# Patient Record
Sex: Male | Born: 1995
Health system: Southern US, Community
[De-identification: ages and names within clinical notes are randomized; demographics above are authoritative.]

---

## 2017-01-03 ENCOUNTER — Emergency Department (HOSPITAL_BASED_OUTPATIENT_CLINIC_OR_DEPARTMENT_OTHER): Payer: Worker's Compensation

## 2017-01-03 ENCOUNTER — Emergency Department (HOSPITAL_BASED_OUTPATIENT_CLINIC_OR_DEPARTMENT_OTHER)
Admission: EM | Admit: 2017-01-03 | Discharge: 2017-01-03 | Disposition: A | Discharge status: Home / Self Care | Payer: Worker's Compensation | Attending: Emergency Medicine | Admitting: Emergency Medicine

## 2017-01-03 ENCOUNTER — Encounter (HOSPITAL_BASED_OUTPATIENT_CLINIC_OR_DEPARTMENT_OTHER): Payer: Self-pay

## 2017-01-03 DIAGNOSIS — S67196A Crushing injury of right little finger, initial encounter: Secondary | ICD-10-CM | POA: Insufficient documentation

## 2017-01-03 DIAGNOSIS — W230XXA Caught, crushed, jammed, or pinched between moving objects, initial encounter: Secondary | ICD-10-CM | POA: Insufficient documentation

## 2017-01-03 DIAGNOSIS — S62639B Displaced fracture of distal phalanx of unspecified finger, initial encounter for open fracture: Secondary | ICD-10-CM

## 2017-01-03 DIAGNOSIS — F17200 Nicotine dependence, unspecified, uncomplicated: Secondary | ICD-10-CM | POA: Diagnosis not present

## 2017-01-03 DIAGNOSIS — Y990 Civilian activity done for income or pay: Secondary | ICD-10-CM | POA: Diagnosis not present

## 2017-01-03 DIAGNOSIS — Y929 Unspecified place or not applicable: Secondary | ICD-10-CM | POA: Insufficient documentation

## 2017-01-03 DIAGNOSIS — Y939 Activity, unspecified: Secondary | ICD-10-CM | POA: Insufficient documentation

## 2017-01-03 DIAGNOSIS — S6991XA Unspecified injury of right wrist, hand and finger(s), initial encounter: Secondary | ICD-10-CM | POA: Diagnosis present

## 2017-01-03 MED ORDER — CEPHALEXIN 500 MG PO CAPS: ORAL_CAPSULE | ORAL | 0 refills | Status: AC

## 2017-01-03 MED ORDER — MELOXICAM 15 MG PO TABS: 15.0000 mg | ORAL_TABLET | Freq: Every day | ORAL | 0 refills | Status: AC

## 2017-01-03 MED FILL — MELOXICAM 15 MG TABLET: 15 | 10 days supply | Qty: 10 | Fill #0

## 2017-01-03 MED FILL — CEPHALEXIN 500 MG CAPSULE: 500 | 5 days supply | Qty: 20 | Fill #0

## 2017-01-03 NOTE — Discharge Instructions (Signed)
Contact a health care provider if: °Your pain or swelling gets worse even with treatment. °You have trouble moving your finger. °Get help right away if: °Your finger becomes numb or blue. °

## 2017-01-03 NOTE — ED Triage Notes (Signed)
Pt injured right hand today at work-was seen at [facility]-pt states finger has been sutured-paperwork reads fx to 4th 5th right fingers-dsg intact to right 3-5 fingers-NAD-steady gait

## 2017-01-03 NOTE — ED Provider Notes (Addendum)
[facility]   CSN: [id number] Arrival date & time: 01/03/17  1147     History   Chief Complaint Chief Complaint  Patient presents with  . Hand Injury    HPI Adam Rice is a 21 y.o. male who presents emergency Department with chief complaint of finger injury. 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 Patient cut his hand smashed in a cart. He is right-hand dominant. He is diagnosed with an open tuft fracture of his right distal fifth phalanx and a small laceration to his fourth phalanx. He is given a gram of Ancef, had his tetanus updated and the PA there was unable to contact a hand surgeon for follow-up. Patient was sent here for further evaluation. He was not given images of his fracture for me to see and therefore had a repeat immature in ED. He denies any numbness tingling.  HPI  History reviewed. No pertinent past medical history.  There are no active problems to display for this patient.   History reviewed. No pertinent surgical history.     Home Medications    Prior to Admission medications   Not on File    Family History No family history on file.  Social History Social History  Substance Use Topics  . Smoking status: Current Every Day Smoker  . Smokeless tobacco: Never Used  . Alcohol use Yes     Comment: occ     Allergies   Patient has no known allergies.   Review of Systems Review of Systems Ten systems reviewed and are negative for acute change, except as noted in the HPI.    Physical Exam Updated Vital Signs BP (!) 140/95 (BP Location: Right Arm)   Pulse 70   Temp 98.7 F (37.1 C) (Oral)   Resp 20   Ht  (1.753 m)   Wt 60.3 kg (133 lb)   SpO2 100%   BMI 19.64 kg/m   Physical Exam  Constitutional: He appears well-developed and well-nourished. No distress.  HENT:  Head: Normocephalic and atraumatic.  Eyes: Conjunctivae are normal. No scleral icterus.    Neck: Normal range of motion. Neck supple.  Cardiovascular: Normal rate, regular rhythm and normal heart sounds.   Pulmonary/Chest: Effort normal and breath sounds normal. No respiratory distress.  Abdominal: Soft. There is no tenderness.  Musculoskeletal: He exhibits no edema.  Right distal fifth phalanx with sutures, all no active bleeding. There is some subungual hematoma at the proximal nail bed. There is a laceration which is sutured at the base of the fourth MCP the palmar side. No active bleeding Refill less than 2 seconds  Neurological: He is alert.  Skin: Skin is warm and dry. He is not diaphoretic.  Psychiatric: His behavior is normal.  Nursing note and vitals reviewed.    ED Treatments / Results  Labs (all labs ordered are listed, but only abnormal results are displayed) Labs Reviewed - No data to display  EKG  EKG Interpretation None       Radiology Dg Hand Complete Right  Result Date: 01/03/2017 CLINICAL DATA:  Hand injury involving the third through fifth digits. Initial encounter. EXAM: RIGHT HAND - COMPLETE 3+ VIEW COMPARISON:  None. FINDINGS: There is a minimally displaced tuft fracture of the distal phalanx of the small finger. No other fracture is identified. There is no dislocation. There is soft tissue swelling involving the distal aspect of the small finger and mid aspect of the  ring and long fingers. IMPRESSION: Distal tuft fracture of the small finger distal phalanx. Electronically Signed   By: Sebastian Ache M.D.   On: 01/03/2017 14:49    Procedures Procedures (including critical care time) SPLINT APPLICATION Date/Time: 3:55 PM Authorized by: Arthor Captain Consent: Verbal consent obtained. Risks and benefits: risks, benefits and alternatives were discussed Consent given by: patient Splint applied by: orthopedic technician Location details: r finger Splint type: finger Post-procedure: The splinted body part was neurovascularly unchanged following  the procedure. Patient tolerance: Patient tolerated the procedure well with no immediate complications.    Medications Ordered in ED Medications - No data to display   Initial Impression / Assessment and Plan / ED Course  I have reviewed the triage vital signs and the nursing notes.  Pertinent labs & imaging results that were available during my care of the patient were reviewed by me and considered in my medical decision making (see chart for details).     Patient with small tuft fracture of the right distal phalanx. Repeat images showed no fractures requiring immediate intervention by hand surgeon. Patient given 1 g Ancef prior to arrival and will be discharged with oral Keflex 5 days. Patient is advised to follow-up with the hand surgeon on call. Placed in finger splints. He will be written out of work until he follows up for clearance with the [employer].  Final Clinical Impressions(s) / ED Diagnoses   Final diagnoses:  Open fracture of tuft of distal phalanx of finger    New Prescriptions New Prescriptions   No medications on file     Arthor Captain, PA-C 01/03/17 1526    Rolan Bucco, MD 01/03/17 1527    Arthor Captain, PA-C 01/03/17 1555    Rolan Bucco, MD 01/03/17 1921

## 2019-03-10 IMAGING — DX DG HAND COMPLETE 3+V*R*
3 series · 3 of 3 positions shown · non-contrast
Comparison: None.

CLINICAL DATA: Hand injury involving the third through fifth
digits. Initial encounter.

EXAM:
RIGHT HAND - COMPLETE 3+ VIEW

[hand pa]
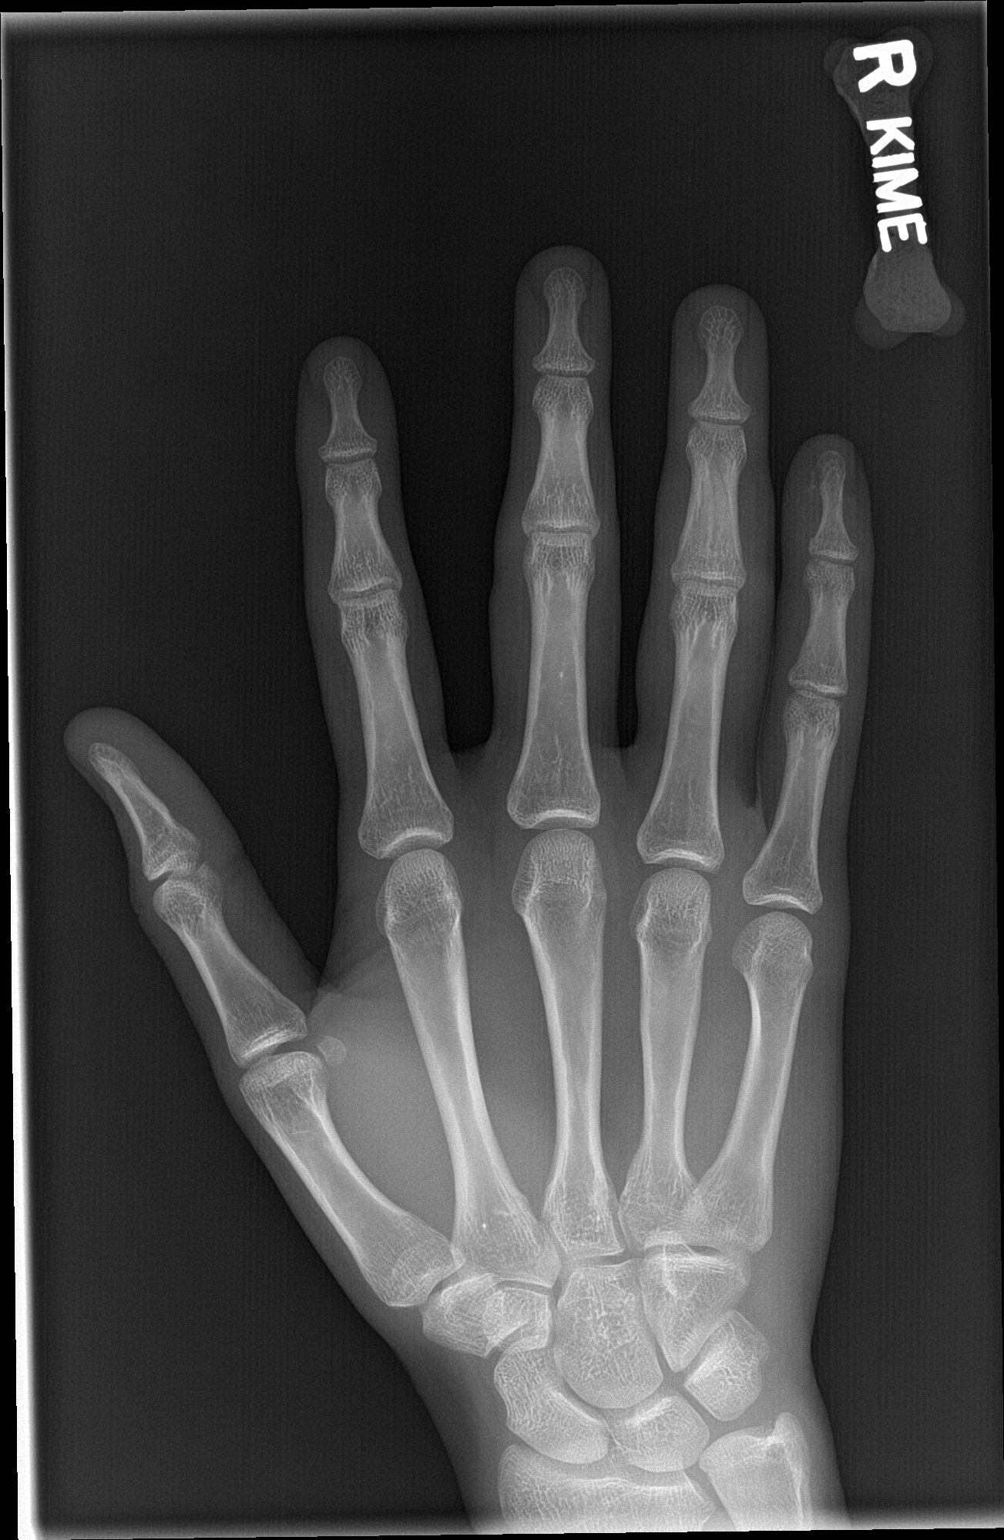

[hand obl]
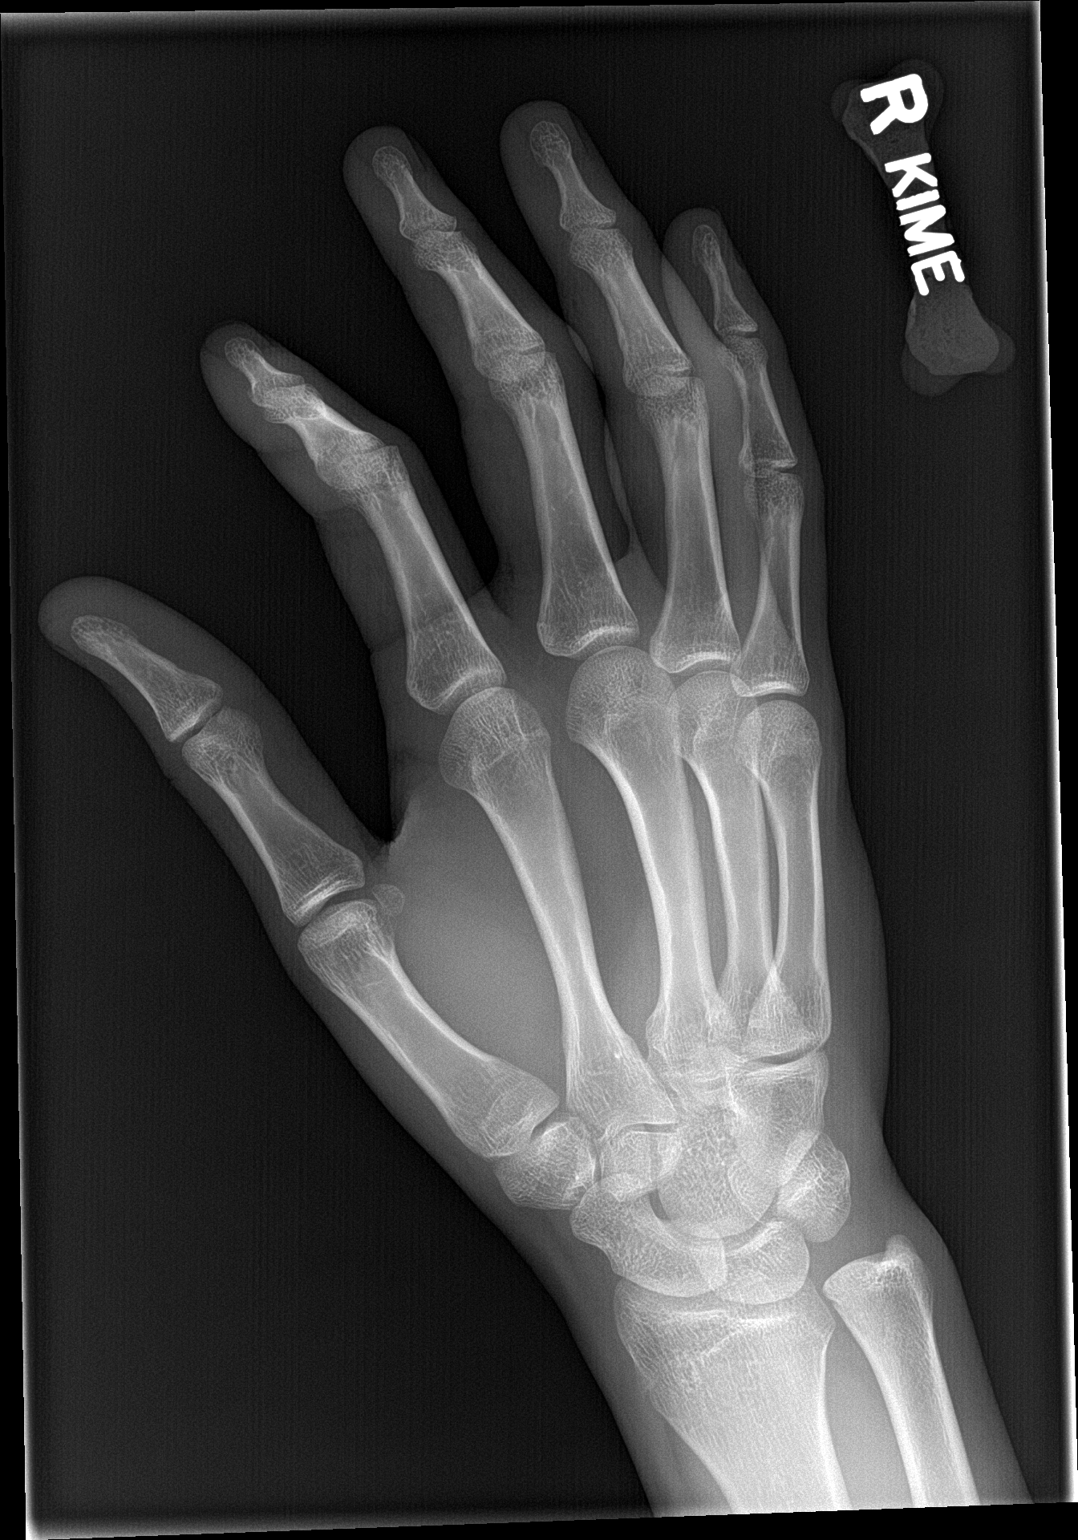

[hand lat]
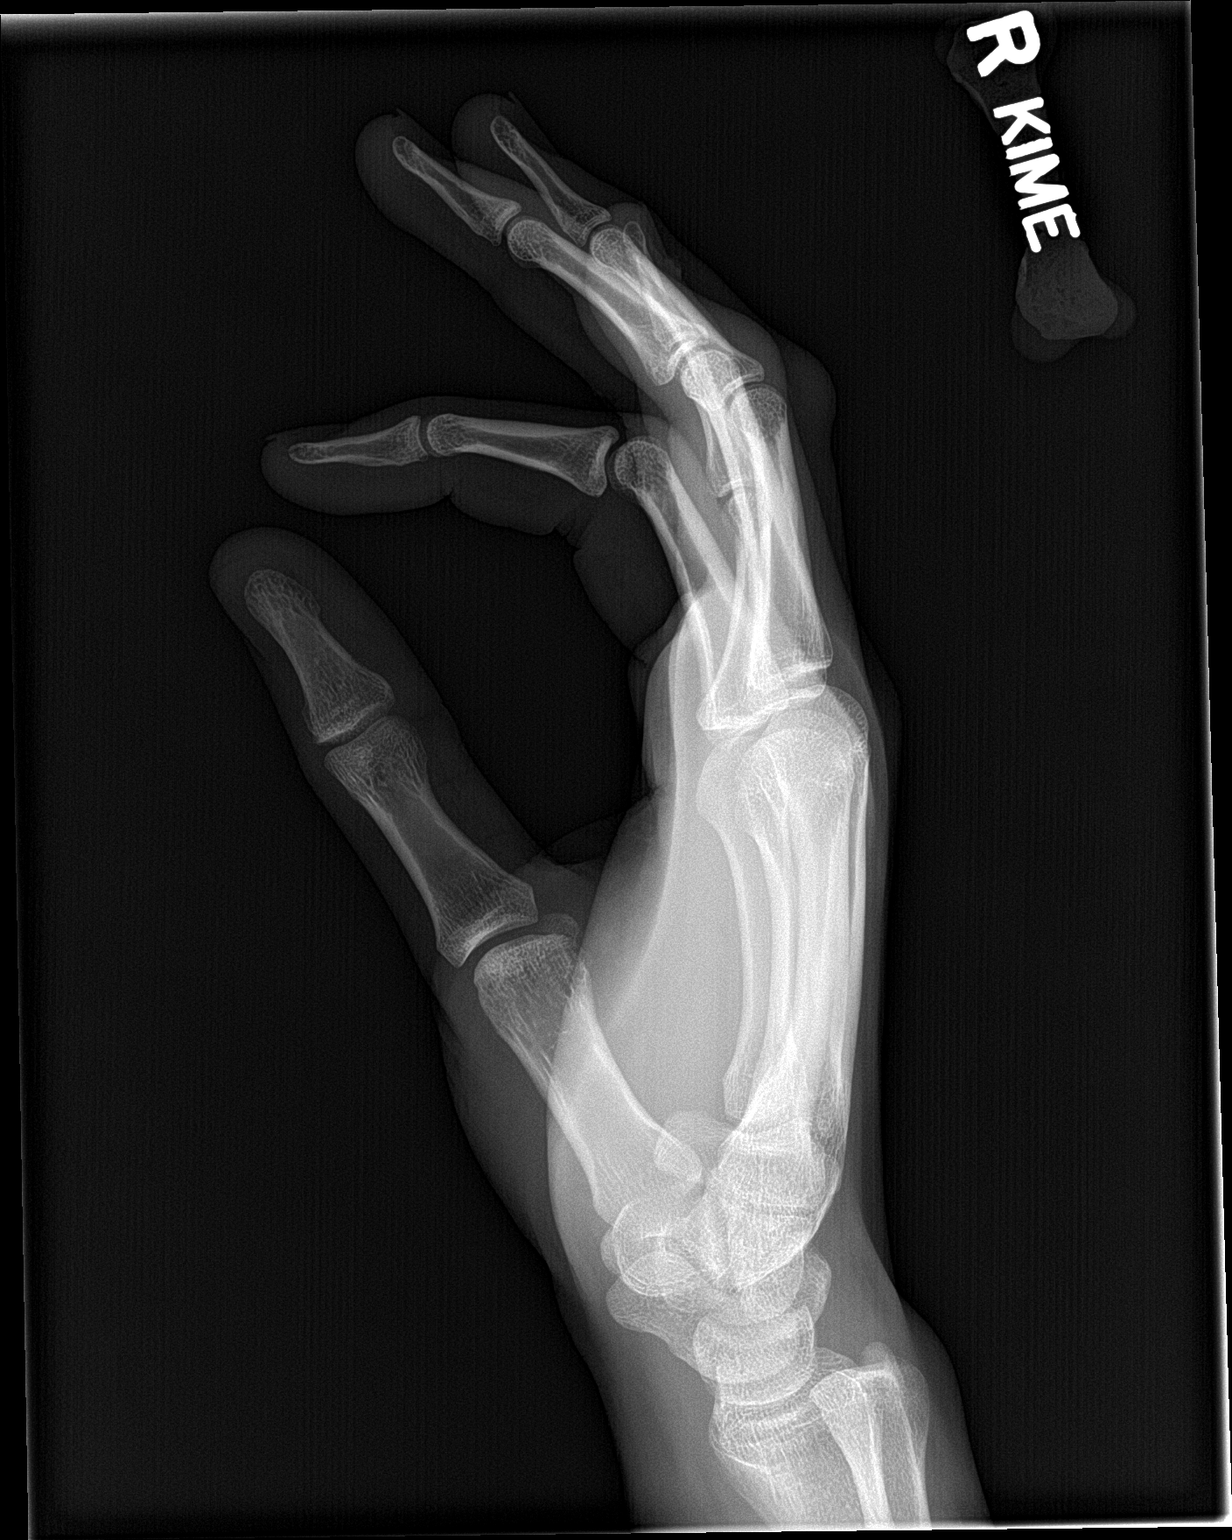

[3 of 3 positions shown; findings below may reference images not displayed]

FINDINGS: There is a minimally displaced tuft fracture of the distal phalanx
of the small finger. No other fracture is identified. There is no
dislocation. There is soft tissue swelling involving the distal
aspect of the small finger and mid aspect of the ring and long
fingers.
IMPRESSION: Distal tuft fracture of the small finger distal phalanx.

## 2023-11-05 ENCOUNTER — Emergency Department (HOSPITAL_COMMUNITY)
Admission: EM | Admit: 2023-11-05 | Discharge: 2023-11-06 | Disposition: A | Discharge status: Home / Self Care | Payer: MEDICAID

## 2023-11-05 ENCOUNTER — Encounter (HOSPITAL_COMMUNITY): Payer: Self-pay | Admitting: *Deleted

## 2023-11-05 ENCOUNTER — Other Ambulatory Visit: Payer: Self-pay

## 2023-11-05 DIAGNOSIS — K047 Periapical abscess without sinus: Secondary | ICD-10-CM | POA: Insufficient documentation

## 2023-11-05 DIAGNOSIS — K219 Gastro-esophageal reflux disease without esophagitis: Secondary | ICD-10-CM | POA: Insufficient documentation

## 2023-11-05 MED ORDER — KETOROLAC TROMETHAMINE 15 MG/ML IJ SOLN
15.0000 mg | Freq: Once | INTRAMUSCULAR | Status: AC
  Administered 2023-11-05 (×2): 15 mg via INTRAMUSCULAR
  Filled 2023-11-05: qty 1

## 2023-11-05 MED ORDER — FAMOTIDINE 20 MG PO TABS
40.0000 mg | ORAL_TABLET | Freq: Every day | ORAL | 0 refills | Status: AC
  Filled 2023-11-05: qty 30, 15d supply, fill #0

## 2023-11-05 MED ORDER — AMOXICILLIN-POT CLAVULANATE 875-125 MG PO TABS
1.0000 | ORAL_TABLET | Freq: Once | ORAL | Status: AC
  Administered 2023-11-05 (×2): 1 via ORAL
  Filled 2023-11-05: qty 1

## 2023-11-05 MED ORDER — AMOXICILLIN-POT CLAVULANATE 875-125 MG PO TABS
1.0000 | ORAL_TABLET | Freq: Two times a day (BID) | ORAL | 0 refills | Status: AC
  Filled 2023-11-05 (×2): qty 14, 7d supply, fill #0

## 2023-11-05 MED ORDER — FAMOTIDINE 20 MG PO TABS
40.0000 mg | ORAL_TABLET | Freq: Once | ORAL | Status: AC
  Administered 2023-11-05 (×2): 40 mg via ORAL
  Filled 2023-11-05: qty 2

## 2023-11-05 NOTE — ED Triage Notes (Signed)
 Toothache for one month no known temp

## 2023-11-05 NOTE — Discharge Instructions (Addendum)
 Call and follow-up with your dentist.  Take your Pepcid daily as prescribed.  Take your antibiotics daily as prescribed.  You can use Tylenol Motrin as needed for pain.

## 2023-11-05 NOTE — ED Provider Notes (Signed)
 [place] EMERGENCY DEPARTMENT AT [facility] Provider Note   CSN: [id number] Arrival date & time: 11/05/23  2231     Patient presents with: Dental Pain   Adam Rice is a 28 y.o. male.   28 year old male presents for evaluation of dental pain.  States his stomach has also become upset today as he has been taking more Tylenol Motrin than usual.  He has not been on antibiotics.  States the pain is primarily in the left upper teeth and cheek.  Denies any other symptoms or concerns.   Dental Pain Associated symptoms: no fever        Prior to Admission medications   Medication Sig Start Date End Date Taking? Authorizing Provider  amoxicillin -clavulanate (AUGMENTIN ) 875-125 MG tablet Take 1 tablet by mouth every 12 (twelve) hours. 11/05/23  Yes Mckaila Duffus L, DO  famotidine  (PEPCID ) 20 MG tablet Take 2 tablets (40 mg total) by mouth daily. 11/05/23  Yes Mattison Golay L, DO  cephALEXin  (KEFLEX ) 500 MG capsule 2 caps po bid x 5 days 01/03/17   Harris, Abigail, PA-C  meloxicam  (MOBIC ) 15 MG tablet Take 1 tablet (15 mg total) by mouth daily. Take 1 daily with food. 01/03/17   Harris, Abigail, PA-C    Allergies: Patient has no known allergies.    Review of Systems  Constitutional:  Negative for chills and fever.  HENT:  Negative for ear pain and sore throat.        Admits left tooth pain and left cheek pain  Eyes:  Negative for pain and visual disturbance.  Respiratory:  Negative for cough and shortness of breath.   Cardiovascular:  Negative for chest pain and palpitations.  Gastrointestinal:  Negative for abdominal pain and vomiting.  Genitourinary:  Negative for dysuria and hematuria.  Musculoskeletal:  Negative for arthralgias and back pain.  Skin:  Negative for color change and rash.  Neurological:  Negative for seizures and syncope.  All other systems reviewed and are negative.   Updated Vital Signs BP (!) 147/84 (BP Location: Right Arm)   Pulse 62    Temp 98.1 F (36.7 C) (Oral)   Resp 18   Ht 5' 9 (1.753 m)   Wt 60.3 kg   SpO2 97%   BMI 19.63 kg/m   Physical Exam Vitals and nursing note reviewed.  Constitutional:      General: He is not in acute distress.    Appearance: He is well-developed.  HENT:     Head: Normocephalic and atraumatic.     Mouth/Throat:     Comments: Poor dentition with no obvious signs of abscess or erythema Eyes:     Conjunctiva/sclera: Conjunctivae normal.  Cardiovascular:     Rate and Rhythm: Normal rate and regular rhythm.     Heart sounds: No murmur heard. Pulmonary:     Effort: Pulmonary effort is normal. No respiratory distress.     Breath sounds: Normal breath sounds.  Abdominal:     Palpations: Abdomen is soft.     Tenderness: There is no abdominal tenderness.  Musculoskeletal:        General: No swelling.     Cervical back: Neck supple.  Skin:    General: Skin is warm and dry.     Capillary Refill: Capillary refill takes less than 2 seconds.  Neurological:     Mental Status: He is alert.  Psychiatric:        Mood and Affect: Mood normal.     (all labs  ordered are listed, but only abnormal results are displayed) Labs Reviewed - No data to display  EKG: None  Radiology: No results found.   Procedures   Medications Ordered in the ED  amoxicillin -clavulanate (AUGMENTIN ) 875-125 MG per tablet 1 tablet (has no administration in time range)  ketorolac  (TORADOL ) 15 MG/ML injection 15 mg (has no administration in time range)  famotidine  (PEPCID ) tablet 40 mg (has no administration in time range)                                    Medical Decision Making Patient with likely dental infection.  Will prescribe him antibiotics.  Given his first dose here.  I did give him a dose of Toradol  and will give him a prescription for Pepcid  as well as it sounds like he is having GERD.  Advised to decrease his use of ibuprofen and use Tylenol as needed for pain.  Advise close follow-up with  dentistry and otherwise return to the ER for any worsening symptoms.  He feels comfortable being discharged home.  Problems Addressed: Dental infection: acute illness or injury Gastroesophageal reflux disease without esophagitis: acute illness or injury  Amount and/or Complexity of Data Reviewed External Data Reviewed: notes.    Details: Prior ED records reviewed and patient was seen on the eighth for dental abscess  Risk OTC drugs. Prescription drug management.     Final diagnoses:  Gastroesophageal reflux disease without esophagitis  Dental infection    ED Discharge Orders          Ordered    famotidine  (PEPCID ) 20 MG tablet  Daily        11/05/23 2314    amoxicillin -clavulanate (AUGMENTIN ) 875-125 MG tablet  Every 12 hours        11/05/23 2314               Gennaro Duwaine CROME, DO 11/05/23 2321

## 2023-11-06 ENCOUNTER — Other Ambulatory Visit (HOSPITAL_COMMUNITY): Payer: Self-pay
